# Patient Record
Sex: Female | Born: 1975 | Race: White | Hispanic: No | Marital: Married | Smoking: Never smoker
Health system: Southern US, Community
[De-identification: ages and names within clinical notes are randomized; demographics above are authoritative.]

## PROBLEM LIST (undated history)

## (undated) DIAGNOSIS — L409 Psoriasis, unspecified: Secondary | ICD-10-CM

## (undated) HISTORY — PX: WISDOM TOOTH EXTRACTION: SHX21

## (undated) HISTORY — DX: Psoriasis, unspecified: L40.9

---

## 2011-11-11 DIAGNOSIS — J45909 Unspecified asthma, uncomplicated: Secondary | ICD-10-CM | POA: Insufficient documentation

## 2016-01-14 ENCOUNTER — Encounter: Payer: Self-pay | Admitting: Obstetrics & Gynecology

## 2016-01-14 ENCOUNTER — Ambulatory Visit (INDEPENDENT_AMBULATORY_CARE_PROVIDER_SITE_OTHER): Payer: BLUE CROSS/BLUE SHIELD | Admitting: Obstetrics & Gynecology

## 2016-01-14 VITALS — BP 139/78 | HR 52 | Resp 16 | Ht 64.25 in | Wt 169.0 lb

## 2016-01-14 DIAGNOSIS — N921 Excessive and frequent menstruation with irregular cycle: Secondary | ICD-10-CM

## 2016-01-14 DIAGNOSIS — T384X5A Adverse effect of oral contraceptives, initial encounter: Secondary | ICD-10-CM

## 2016-01-14 MED ORDER — ESTRADIOL 1 MG PO TABS: 1.0000 mg | ORAL_TABLET | Freq: Every day | ORAL | Status: DC

## 2016-01-15 DIAGNOSIS — T384X5A Adverse effect of oral contraceptives, initial encounter: Secondary | ICD-10-CM | POA: Insufficient documentation

## 2016-01-15 NOTE — Progress Notes (Signed)
   Subjective:    Patient ID: Latoya Fisher, female    DOB: [birth year], 40 y.o.   MRN: [id number]  HPI  40 year old nulliparous female presents for evaluation of bleeding while on continuous OCPs. Patient's menstrual history is as follows.  Menarche around [age]. Patient went on birth control around [age] due to severe heavy periods are irregular. Patient was on 1 pack a month and had a normal menses her life. She never remembers going off birth control for long periods of time. Several years ago she married and wanted to have less menstrual cycles in order to have a better sexual life. The patient started taking continuous OCPs and having 1. Every 3 packs. This went well for approximately 1-1/2 years. The past few months she has had some spotting before the placebo pills. This last instance she bled for 12 days. Patient would like the bleeding to stop. Patient has no associated pelvic pain. The bleeding is mostly spotting and brown. Patient is not taking anything to make the bleeding better or worse.  Review of Systems  Constitutional: Negative for activity change, fatigue and unexpected weight change.  Respiratory: Negative.   Cardiovascular: Negative.   Gastrointestinal: Negative.   Genitourinary: Positive for menstrual problem. Negative for pelvic pain.  Musculoskeletal: Negative.   Skin: Negative.   Psychiatric/Behavioral: Negative.    History reviewed. No pertinent past medical history.     Objective:   Physical Exam  Constitutional: She is oriented to person, place, and time. She appears well-developed and well-nourished. No distress.  HENT:  Head: Normocephalic and atraumatic.  Eyes: Conjunctivae are normal.  Cardiovascular: Regular rhythm.   Pulmonary/Chest: Effort normal and breath sounds normal.  Abdominal: Soft. Bowel sounds are normal. She exhibits no distension and no mass. There is no tenderness. There is no rebound and no guarding.  Genitourinary:  Vulva: No  lesion Tanner 5 Vagina pink normal rugated with small amount of old blood at the top of the vault. Cervix nulliparous no lesion Uterus not enlarged and nontender Adnexa no masses nontender  Musculoskeletal: She exhibits no edema.  Neurological: She is alert and oriented to person, place, and time.  Skin: Skin is warm and dry.  Psychiatric: She has a normal mood and affect.  Vitals reviewed.         Assessment & Plan:  40 yo female nulliparous female with bleeding on continuous OPCPs   1-Offered TVUS to look for lining thickness and structural abnormality vs changing to having monthly menses with typical use of OCPs. 2- Due to insurance (high deductible) pt will try monthly menses.  She will ad estradiol 1 mg for 5 days, then finish this pack including placebo.  If bleeding profile becomes normal on monthly use of OCPS (including placebo), no further workup is necessary.  If still having bleeding will proceed with TVUS. 3-  RTC 2 months

## 2016-02-18 ENCOUNTER — Telehealth: Payer: Self-pay | Admitting: *Deleted

## 2016-02-18 ENCOUNTER — Encounter: Payer: Self-pay | Admitting: Obstetrics & Gynecology

## 2016-02-18 DIAGNOSIS — N939 Abnormal uterine and vaginal bleeding, unspecified: Secondary | ICD-10-CM

## 2016-02-18 NOTE — Telephone Encounter (Signed)
Received e-mail from patient stating that she is continuing to have bleeding every day.  Per Dr Gala Romney ordered placed for a pelvic U/S .  LM on pt's voicemail to call office to schedule the U/S.

## 2016-02-22 ENCOUNTER — Ambulatory Visit (INDEPENDENT_AMBULATORY_CARE_PROVIDER_SITE_OTHER): Payer: BLUE CROSS/BLUE SHIELD

## 2016-02-22 DIAGNOSIS — N939 Abnormal uterine and vaginal bleeding, unspecified: Secondary | ICD-10-CM

## 2016-02-26 ENCOUNTER — Encounter: Payer: Self-pay | Admitting: Obstetrics & Gynecology

## 2016-03-31 ENCOUNTER — Ambulatory Visit (INDEPENDENT_AMBULATORY_CARE_PROVIDER_SITE_OTHER): Payer: BLUE CROSS/BLUE SHIELD | Admitting: Obstetrics & Gynecology

## 2016-03-31 ENCOUNTER — Encounter: Payer: Self-pay | Admitting: Obstetrics & Gynecology

## 2016-03-31 VITALS — BP 119/73 | HR 55 | Resp 16 | Ht 64.5 in | Wt 165.0 lb

## 2016-03-31 DIAGNOSIS — Z3041 Encounter for surveillance of contraceptive pills: Secondary | ICD-10-CM

## 2016-03-31 DIAGNOSIS — N926 Irregular menstruation, unspecified: Secondary | ICD-10-CM | POA: Diagnosis not present

## 2016-03-31 MED ORDER — PORTIA-28 0.15-30 MG-MCG PO TABS: 1.0000 | ORAL_TABLET | Freq: Every day | ORAL | Status: DC

## 2016-03-31 NOTE — Progress Notes (Signed)
Latoya Fisher presents for follow-up of menstrual abnormality on her oral contraceptives. She did continue to have bleeding daily until 02/22/2016, at that point she was going for her transvaginal ultrasound. She change her continuous oral contraceptives to taking the placebo pills each month. March was her first menses of taking the birth control pills with placebo. Since March, patient has not had any bleeding. She is due for her period this week. Her transvaginal ultrasound shows a normal uterus with normal contour and a 2 mm lining. Results are below.  FINDINGS: Uterus  Measurements: 7.5 x 2.9 x 4.7 cm. No fibroids or other mass visualized.  Endometrium  Thickness: 2 mm in thickness. No focal abnormality visualized.  Right ovary  Measurements: 2.1 x 1.4 x 2.0 cm. Normal appearance/no adnexal mass.  Left ovary  Measurements: 2.9 x 1.6 x 2.0 cm. Normal appearance/no adnexal mass.  Other findings  Trace free fluid in the pelvis  IMPRESSION: Unremarkable study.  Filed Vitals:   03/31/16 0824  BP: 119/73  Pulse: 55  Resp: 16  Height: 5' 4.5" (1.638 m)  Weight: 165 lb (74.844 kg)     Patient had lengthy discussion about her menstrual period, taking continuous oral contraceptives, taking oral contraceptives with placebo pill, and the menstrual cycle one enters her 40s. Patient has decided to continue oral contraceptives take the placebo pills. She may go to periods f consecutive birth control pills later this summer. She has a birthday trip in October [place] and would not like to have her period during that time. She also had questions about conceiving. She is most likely not going to have children but has not completely ruled out this posibility. We will have further discussion if she decides to become pregnant.  Patient also had questions about when to start breast cancer screening. We discussed [employer] of obstetrics and gynecology, American  Cancer Society, and USPSTF recommendations.  Patient does not want to start at age 46. She comes back for her annual exam in January 2018, we will discuss when she would like to start mammograms for breast cancer screening. She will be due for a Pap smear at that time.  15 minutes spent face-to-face with the patient with greater than 50% counseling.

## 2016-12-22 ENCOUNTER — Ambulatory Visit: Payer: BLUE CROSS/BLUE SHIELD | Admitting: Obstetrics & Gynecology

## 2017-01-10 IMAGING — US US PELVIS COMPLETE
1 series · 14 of 25 positions shown · non-contrast
Comparison: None

CLINICAL DATA: Irregular vaginal bleeding for 1-2 years

EXAM:
TRANSABDOMINAL AND TRANSVAGINAL ULTRASOUND OF PELVIS
TECHNIQUE: Both transabdominal and transvaginal ultrasound examinations of the
pelvis were performed. Transabdominal technique was performed for
global imaging of the pelvis including uterus, ovaries, adnexal
regions, and pelvic cul-de-sac. It was necessary to proceed with
endovaginal exam following the transabdominal exam to visualize the
uterus, endometrium, ovaries and adnexa .

[Series 1: us pelvis complete · 0.16mm/px · 14 of 76 slices shown]
[im 1/76]
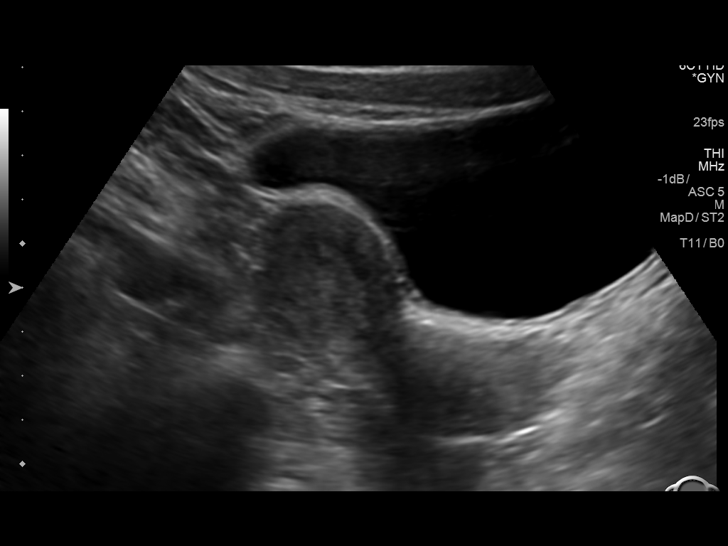
[im 7/76]
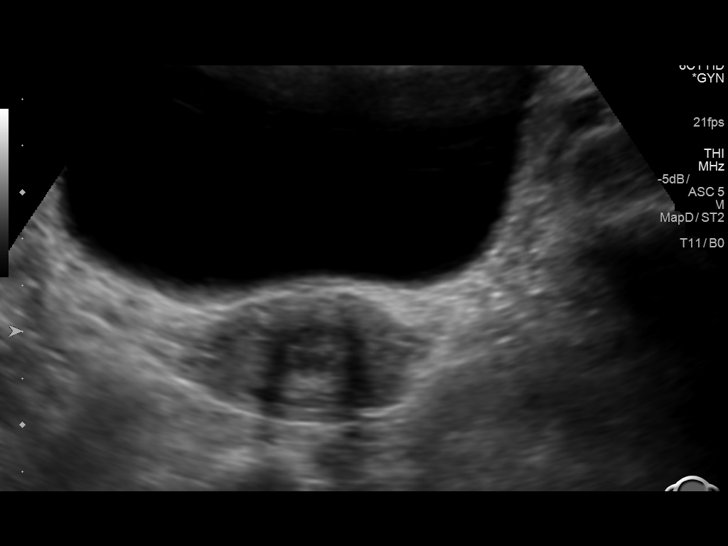
[im 13/76]
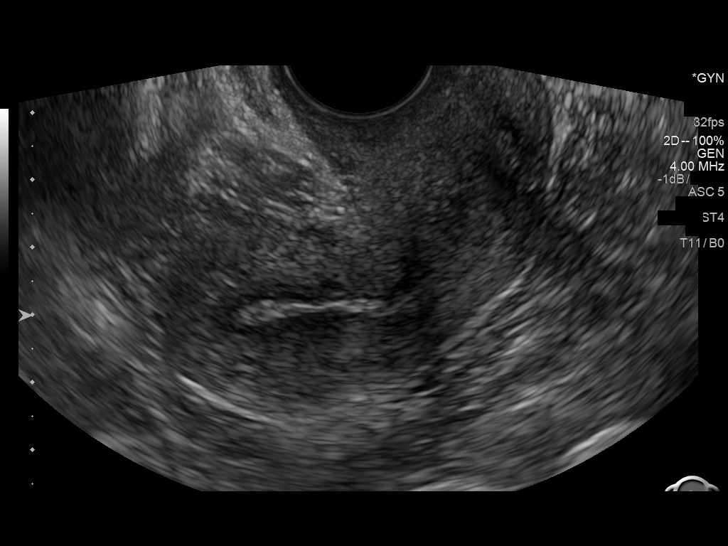
[im 19/76]
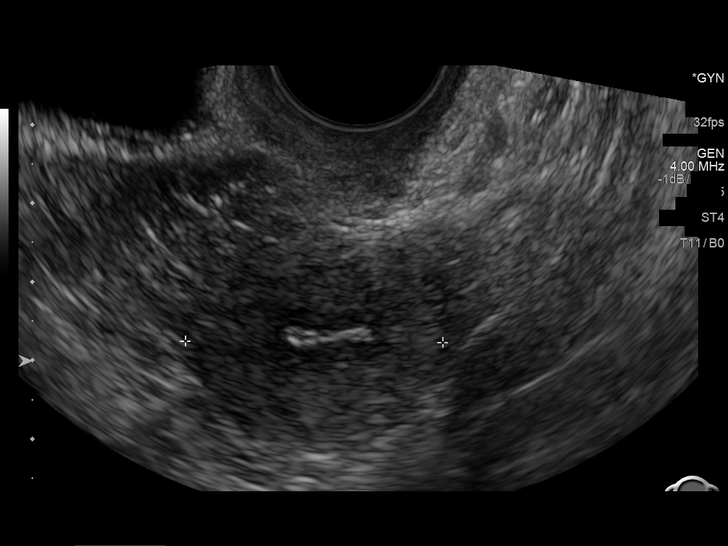
[im 26/76]
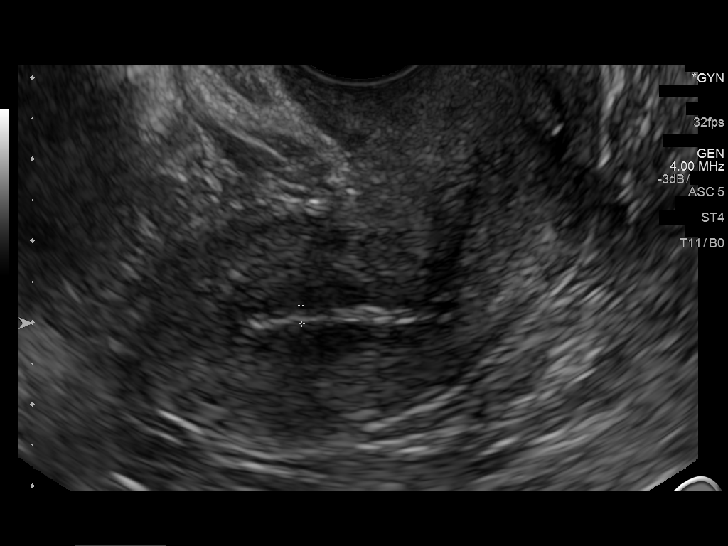
[im 29/76]
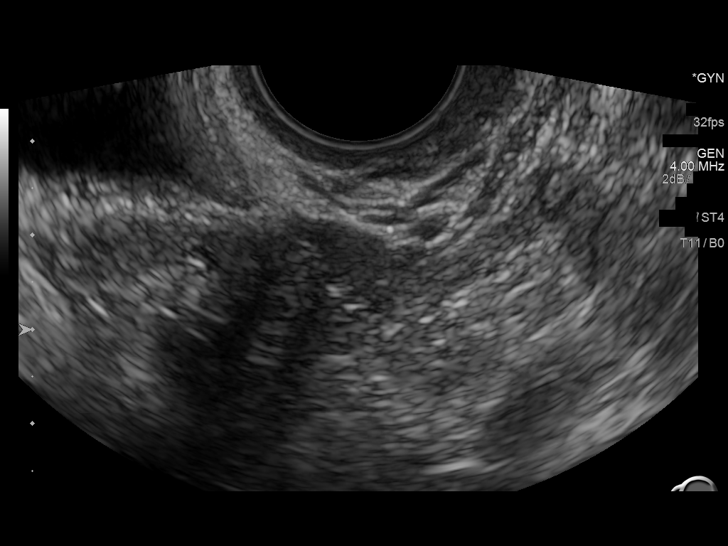
[im 35/76]
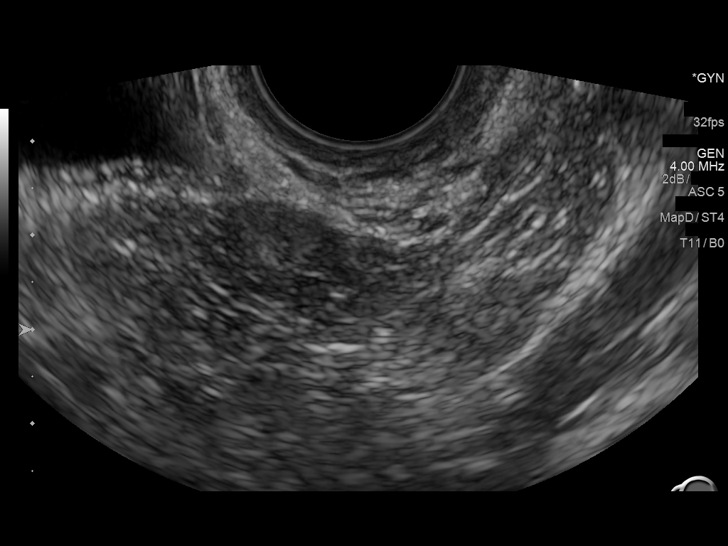
[im 41/76]
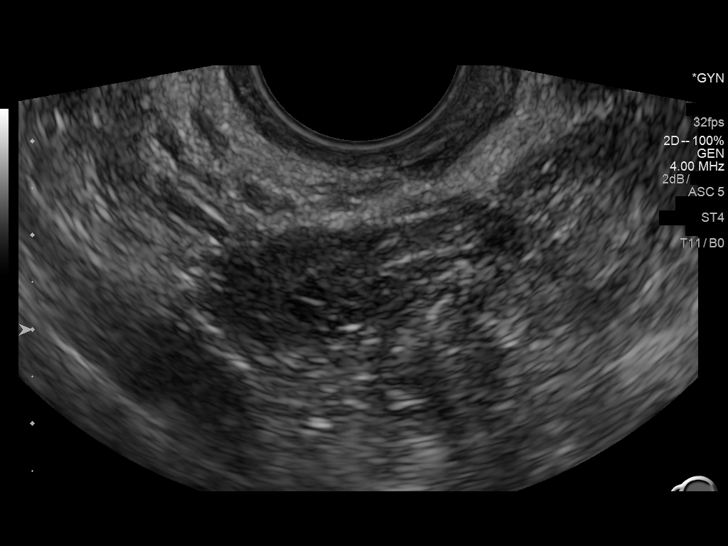
[im 47/76]
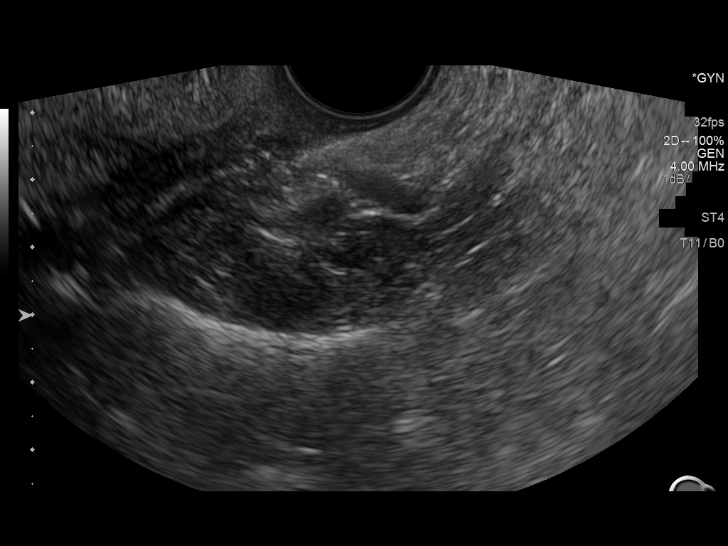
[im 51/76]
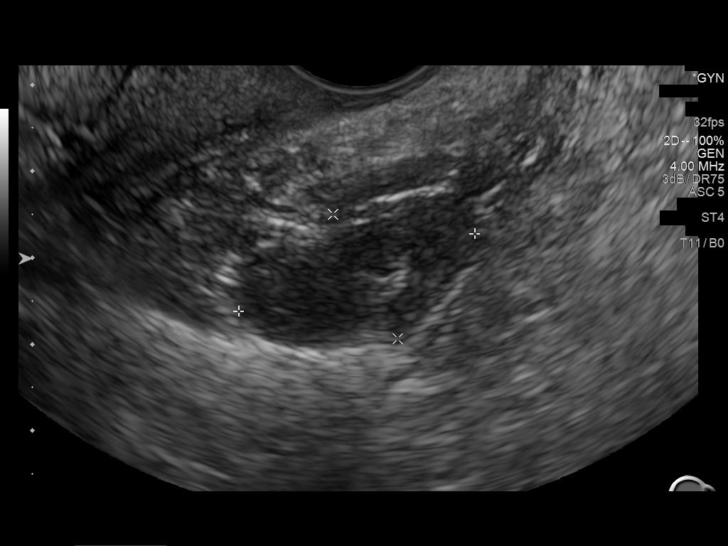
[im 57/76]
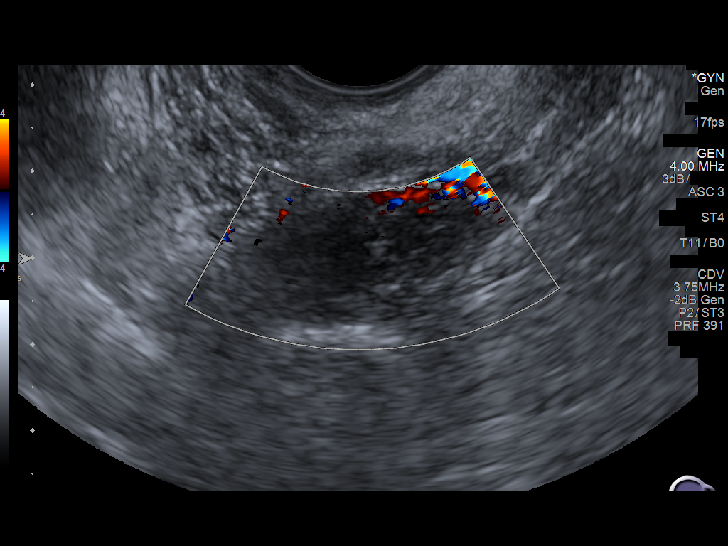
[im 63/76]
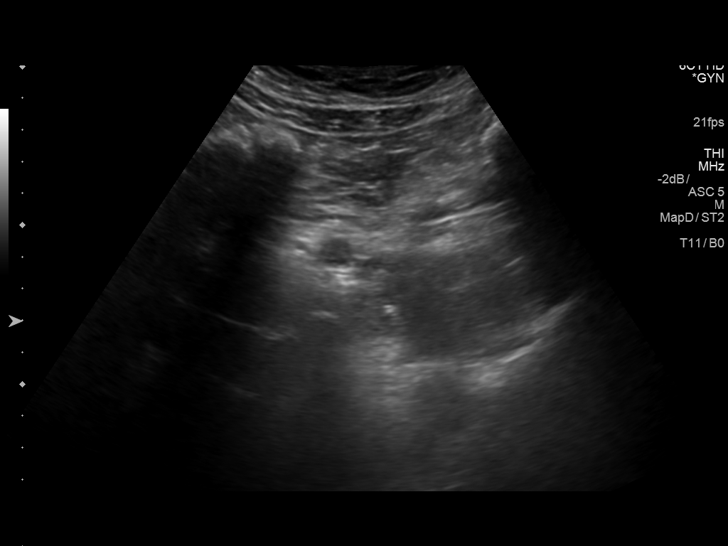
[im 69/76]
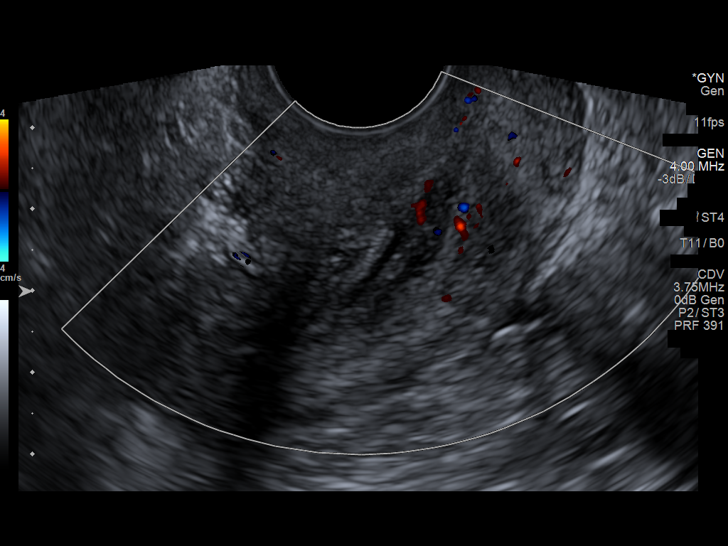
[im 76/76]
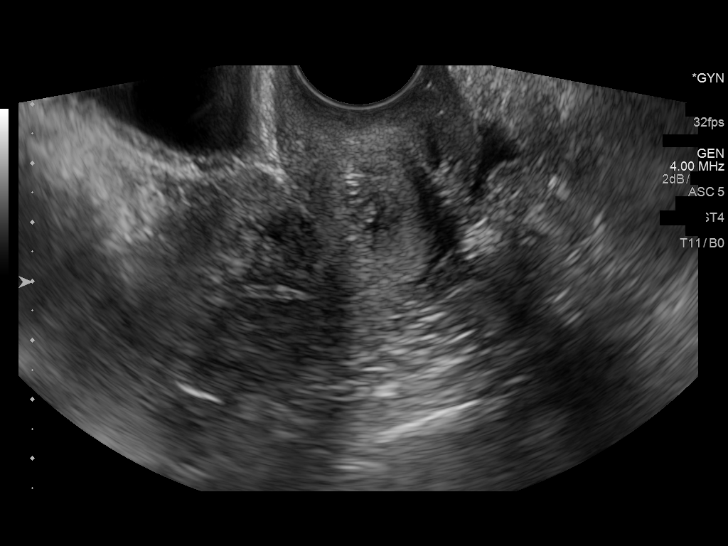

[14 of 25 positions shown; findings below may reference images not displayed]

FINDINGS: Uterus

Measurements: 7.5 x 2.9 x 4.7 cm. No fibroids or other mass
visualized.

Endometrium

Thickness: 2 mm in thickness.  No focal abnormality visualized.

Right ovary

Measurements: 2.1 x 1.4 x 2.0 cm. Normal appearance/no adnexal mass.

Left ovary

Measurements: 2.9 x 1.6 x 2.0 cm. Normal appearance/no adnexal mass.

Other findings

Trace free fluid in the pelvis
IMPRESSION: Unremarkable study.

## 2017-01-14 ENCOUNTER — Ambulatory Visit (INDEPENDENT_AMBULATORY_CARE_PROVIDER_SITE_OTHER): Payer: PRIVATE HEALTH INSURANCE | Admitting: Obstetrics & Gynecology

## 2017-01-14 ENCOUNTER — Encounter: Payer: Self-pay | Admitting: Obstetrics & Gynecology

## 2017-01-14 VITALS — BP 125/86 | HR 60 | Ht 64.0 in | Wt 167.0 lb

## 2017-01-14 DIAGNOSIS — Z01419 Encounter for gynecological examination (general) (routine) without abnormal findings: Secondary | ICD-10-CM

## 2017-01-14 DIAGNOSIS — Z0000 Encounter for general adult medical examination without abnormal findings: Secondary | ICD-10-CM

## 2017-01-14 DIAGNOSIS — N9089 Other specified noninflammatory disorders of vulva and perineum: Secondary | ICD-10-CM

## 2017-01-14 MED ORDER — LEVONORGESTREL-ETHINYL ESTRAD 0.15-30 MG-MCG PO TABS: 1.0000 | ORAL_TABLET | Freq: Every day | ORAL | 12 refills | Status: DC

## 2017-01-14 NOTE — Progress Notes (Signed)
Subjective:     Latoya Fisher is a 41 y.o. female here for a routine exam.  Current complaints: none.  Stopped continuous OCPs and bleeding went away.  Nml TVUS last year.  Gynecologic History Patient's last menstrual period was 01/03/2017. Contraception: OCP (estrogen/progesterone) Last Pap: at outside office--nml per patient; pap with cotesting done today Last mammogram: never--Long discussion on breast cancer screening.  Pt has maternal aunt with postmenopausal breast cancer.  Low risk.  Pt opts to not start mammogram at 10.  Will discuss net year.    Obstetric History OB History  Gravida Para Term Preterm AB Living  0 0 0 0 0 0  SAB TAB Ectopic Multiple Live Births  0 0 0 0           The following portions of the patient's history were reviewed and updated as appropriate: allergies, current medications, past family history, past medical history, past social history, past surgical history and problem list.  Review of Systems Pertinent items noted in HPI and remainder of comprehensive ROS otherwise negative.    Objective:      Vitals:   01/14/17 0831  BP: 125/86  Pulse: 60  Weight: 167 lb (75.8 kg)  Height: 5\' 4"  (1.626 m)   Vitals:  WNL General appearance: alert, cooperative and no distress  HEENT: Normocephalic, without obvious abnormality, atraumatic Eyes: negative Throat: lips, mucosa, and tongue normal; teeth and gums normal  Respiratory: Clear to auscultation bilaterally  CV: Regular rate and rhythm  Breasts:  Normal appearance, no masses or tenderness, no nipple retraction or dimpling  GI: Soft, non-tender; bowel sounds normal; no masses,  no organomegaly  GU: External Genitalia:  Tanner V, white "cigarette paper" lesion between vagina and anus--?psoriasis vs Lichen sclerosis Urethra:  No prolapse   Vagina: Pink, normal rugae, no blood or discharge  Cervix: No CMT, no lesion  Uterus:  Normal size and contour, non tender  Adnexa: Normal, no masses, non  tender  Musculoskeletal: No edema, redness or tenderness in the calves or thighs  Skin: No lesions or rash  Lymphatic: Axillary adenopathy: none     Psychiatric: Normal mood and behavior        Assessment:    Healthy female exam.   Vulvar lesion--LS vs Psoriasis   Plan:    1.   Vulvar biopsy this month 2-Pap with cotesting 3-Mammogram--pts opts to wait 4.  OCP Rx

## 2017-01-15 LAB — CYTOLOGY - PAP
DIAGNOSIS: NEGATIVE
HPV: NOT DETECTED

## 2017-01-28 ENCOUNTER — Telehealth: Payer: Self-pay

## 2017-01-28 NOTE — Telephone Encounter (Signed)
Schedule Latoya Fisher have to check her work calendar then will call office back to set up her bx appointment with Dr. Gala Romney.

## 2017-05-08 ENCOUNTER — Other Ambulatory Visit: Payer: Self-pay | Admitting: Obstetrics & Gynecology

## 2017-05-11 ENCOUNTER — Telehealth: Payer: Self-pay | Admitting: *Deleted

## 2017-05-11 NOTE — Telephone Encounter (Signed)
M on cell phone that she was given 2 months of OCP's and questioned her about the vulvar biopsy.  Told pt to call the office  With what her plans were for having this biopsy since there is no appt scheduled.

## 2017-05-11 NOTE — Telephone Encounter (Signed)
-----   Message from Guss Bunde, MD sent at 05/11/2017 12:10 PM EDT ----- Pt was supposed to get vulvar biopsy.  Can you call and see when she plans to come in?  OCPS renewed for 2 months today.

## 2017-11-18 ENCOUNTER — Encounter: Payer: Self-pay | Admitting: Obstetrics & Gynecology

## 2017-11-18 ENCOUNTER — Ambulatory Visit: Payer: PRIVATE HEALTH INSURANCE | Admitting: Obstetrics & Gynecology

## 2017-11-18 VITALS — BP 132/86 | HR 62 | Ht 64.0 in | Wt 159.0 lb

## 2017-11-18 DIAGNOSIS — N898 Other specified noninflammatory disorders of vagina: Secondary | ICD-10-CM | POA: Diagnosis not present

## 2017-11-18 DIAGNOSIS — N9089 Other specified noninflammatory disorders of vulva and perineum: Secondary | ICD-10-CM

## 2017-11-20 NOTE — Progress Notes (Signed)
   Subjective:    Patient ID: Latoya Fisher, female    DOB: 04-24-1976, 41 y.o.   MRN: [id number]  HPI  Pt presents for f/u to receive vulvar biopsy of area at 6 o'clock seen at yearly exam.  R/o lichen planus / lichen sclerosis. Pt c/o discharge as well.  No odor currently.    Review of Systems  Constitutional: Negative.   Gastrointestinal: Negative.   Genitourinary: Positive for vaginal discharge. Negative for pelvic pain and vaginal pain.       Objective:   Physical Exam  Constitutional: She is oriented to person, place, and time. She appears well-developed and well-nourished. No distress.  HENT:  Head: Normocephalic and atraumatic.  Eyes: Conjunctivae are normal.  Pulmonary/Chest: Effort normal.  Abdominal: Soft. Bowel sounds are normal. There is no tenderness.  Genitourinary:     Genitourinary Comments: Moderate amont of discharge.  Musculoskeletal: She exhibits no edema.  Neurological: She is alert and oriented to person, place, and time.  Skin: Skin is warm and dry.  Psychiatric: She has a normal mood and affect.  Vitals reviewed.  Vitals:   11/18/17 0911  BP: 132/86  Pulse: 62  Weight: 159 lb (72.1 kg)  Height: 5\' 4"  (1.626 m)          Assessment & Plan:  41 yo female with area on vulva concerning for lichen planus. And vaginal discharge.  1.  BD affirm 2.  Vulvar biopsy  VULVAR BIOPSY NOTE  The indications for vulvar biopsy (rule out neoplasia, establish lichen sclerosus diagnosis) were reviewed.   Risks of the biopsy including pain, bleeding, infection, inadequate specimen, scarring and need for additional procedures  were discussed. The patient stated understanding and agreed to undergo procedure today. Consent was signed,  time out performed.  The patient's vulva was prepped with Betadine. 1% lidocaine was injected into fourchette (6 o'clock). A 3-mm punch biopsy was done, biopsy tissue was picked up with sterile forceps and sterile scissors were  used to excise the lesion.  Small bleeding was noted and hemostasis was achieved using silver nitrate sticks.  The patient tolerated the procedure well. Post-procedure instructions  (pelvic rest for one week) were given to the patient. The patient is to call with heavy bleeding, fever greater than 100.4, foul smelling vaginal discharge or other concerns. The patient will be return to clinic in two weeks for discussion of results.

## 2017-11-25 ENCOUNTER — Encounter: Payer: Self-pay | Admitting: Obstetrics & Gynecology

## 2017-12-03 ENCOUNTER — Telehealth: Payer: Self-pay | Admitting: *Deleted

## 2017-12-03 NOTE — Telephone Encounter (Signed)
-----   Message from Guss Bunde, MD sent at 11/27/2017 11:42 AM EST ----- No vaginitis.  Biopsy shows irritant contact dermatitis.  Use scent free soaps and detergents.  If bothersome, can prescribe topical steroid.  RN to call.

## 2017-12-03 NOTE — Telephone Encounter (Signed)
LM on voicemail of biopsy results and recommendations.

## 2018-03-10 ENCOUNTER — Encounter: Payer: Self-pay | Admitting: Obstetrics & Gynecology

## 2018-03-10 ENCOUNTER — Other Ambulatory Visit: Payer: Self-pay | Admitting: *Deleted

## 2018-03-10 MED ORDER — LEVONORGESTREL-ETHINYL ESTRAD 0.15-30 MG-MCG PO TABS: 1.0000 | ORAL_TABLET | Freq: Every day | ORAL | 1 refills | Status: DC

## 2018-03-10 NOTE — Telephone Encounter (Signed)
Pt called stating that she was running out of her OCP's.  She stated that she was seen 12/18 for a vulvar biopsy and did she still need to come in for an annual.  I told her she could have 2 RF's on OCP's but yes she needed and annual and mammogram.  I gave her the number to our imaging center.  RF was sent to [place].

## 2018-04-06 ENCOUNTER — Encounter: Payer: Self-pay | Admitting: Obstetrics & Gynecology

## 2018-04-13 ENCOUNTER — Telehealth: Payer: Self-pay | Admitting: *Deleted

## 2018-04-13 MED ORDER — ESTRADIOL 2 MG PO TABS: 2.0000 mg | ORAL_TABLET | Freq: Every day | ORAL | 0 refills | Status: AC

## 2018-04-13 NOTE — Telephone Encounter (Signed)
Pt notified that per VO Dr Gala Romney has ordered Estradiol 2 mg to take daily for 7 days with her OCP's.  If she has bleeding while on vacation she should double up her OCP's for 3 days  [name]

## 2018-05-05 ENCOUNTER — Ambulatory Visit (INDEPENDENT_AMBULATORY_CARE_PROVIDER_SITE_OTHER): Payer: PRIVATE HEALTH INSURANCE | Admitting: Obstetrics & Gynecology

## 2018-05-05 ENCOUNTER — Encounter: Payer: Self-pay | Admitting: Obstetrics & Gynecology

## 2018-05-05 VITALS — BP 129/86 | HR 61 | Resp 16 | Ht 64.0 in | Wt 163.0 lb

## 2018-05-05 DIAGNOSIS — R5383 Other fatigue: Secondary | ICD-10-CM

## 2018-05-05 DIAGNOSIS — Z01419 Encounter for gynecological examination (general) (routine) without abnormal findings: Secondary | ICD-10-CM

## 2018-05-05 DIAGNOSIS — Z01411 Encounter for gynecological examination (general) (routine) with abnormal findings: Secondary | ICD-10-CM

## 2018-05-05 MED ORDER — LEVONORGESTREL-ETHINYL ESTRAD 0.15-30 MG-MCG PO TABS: ORAL_TABLET | ORAL | 5 refills | Status: DC

## 2018-05-05 NOTE — Addendum Note (Signed)
Addended by: Lyndal Rainbow on: 05/05/2018 11:45 AM   Modules accepted: Orders

## 2018-05-05 NOTE — Addendum Note (Signed)
Addended by: Lyndal Rainbow on: 05/05/2018 11:13 AM   Modules accepted: Orders

## 2018-05-05 NOTE — Progress Notes (Signed)
Subjective:     Latoya Fisher is a 42 y.o. female here for a routine exam.  Current complaints: No longer having vaginal irritation.  Husband stopped using Ax body wash and irritation went away.  Pt having some bleeding during OCPs.  She occasionally skips the placebo pills and then bleeds longer on the third pack.  Pt did not ned to take estrogen (see last telephone note).  Pt is running races and triathlons all summer.  Would like better cycle control. Pt more fatigued than normal and would like screening labs. Unsure if coming form training.   Gynecologic History Patient's last menstrual period was 03/25/2018. Contraception: OCP (estrogen/progesterone) Last Pap: 2018. Results were: normal Last mammogram: unsure, but patient thinks she is due.  Concerned about area on right breast under nipple.   Obstetric History OB History  Gravida Para Term Preterm AB Living  0 0 0 0 0 0  SAB TAB Ectopic Multiple Live Births  0 0 0 0       The following portions of the patient's history were reviewed and updated as appropriate: allergies, current medications, past family history, past medical history, past social history, past surgical history and problem list.  Review of Systems Pertinent items noted in HPI and remainder of comprehensive ROS otherwise negative.    Objective:      Vitals:   05/05/18 0827  BP: 129/86  Pulse: 61  Resp: 16  Weight: 163 lb (73.9 kg)  Height: 5\' 4"  (1.626 m)   Vitals:  WNL General appearance: alert, cooperative and no distress  HEENT: Normocephalic, without obvious abnormality, atraumatic Eyes: negative Throat: lips, mucosa, and tongue normal; teeth and gums normal  Respiratory: Clear to auscultation bilaterally  CV: Regular rate and rhythm  Breasts:  Normal appearance, subcentimeter superficial mass at 7 o'clock under right nipple.  Feels like an inclusion cyst but unsure--DIAGNOTIC MAMMOGRAM  GI: Soft, non-tender; bowel sounds normal; no masses,  no  organomegaly  GU: External Genitalia:  Tanner V, no lesion Urethra:  No prolapse   Vagina: Pink, normal rugae, no blood or discharge  Cervix: No CMT, no lesion  Uterus:  Normal size and contour, non tender  Adnexa: Normal, no masses, non tender  Musculoskeletal: No edema, redness or tenderness in the calves or thighs  Skin: No lesions or rash  Lymphatic: Axillary adenopathy: none     Psychiatric: Normal mood and behavior    Assessment:    Healthy female exam.   Right superficial breast lump Fatigue Bleeding on OCPs   Plan:    1.  Diagnositic mammogram right breast. 2.  Labs as per orders 3.  Pap not due this year. 4.  Try continuous OCPs for 6 months during race season.  Use estrogen prn.  Pt to call or message [place] with with concerns, questions or bleeding.

## 2018-05-07 ENCOUNTER — Other Ambulatory Visit: Payer: PRIVATE HEALTH INSURANCE

## 2018-05-08 LAB — LIPID PANEL
CHOLESTEROL: 143 mg/dL (ref <200)
HDL: 49 mg/dL — AB (ref >50)
LDL Cholesterol (Calc): 75 mg/dL (calc)
Non-HDL Cholesterol (Calc): 94 mg/dL (calc) (ref <130)
Total CHOL/HDL Ratio: 2.9 (calc) (ref <5.0)
Triglycerides: 100 mg/dL (ref <150)

## 2018-05-08 LAB — COMPREHENSIVE METABOLIC PANEL
AG RATIO: 1.5 (calc) (ref 1.0–2.5)
ALT: 13 U/L (ref 6–29)
AST: 17 U/L (ref 10–30)
Albumin: 4 g/dL (ref 3.6–5.1)
Alkaline phosphatase (APISO): 54 U/L (ref 33–115)
BILIRUBIN TOTAL: 0.5 mg/dL (ref 0.2–1.2)
BUN: 14 mg/dL (ref 7–25)
CO2: 22 mmol/L (ref 20–32)
Calcium: 9.1 mg/dL (ref 8.6–10.2)
Chloride: 110 mmol/L (ref 98–110)
Creat: 0.93 mg/dL (ref 0.50–1.10)
GLUCOSE: 101 mg/dL — AB (ref 65–99)
Globulin: 2.6 g/dL (calc) (ref 1.9–3.7)
Potassium: 5 mmol/L (ref 3.5–5.3)
Sodium: 140 mmol/L (ref 135–146)
Total Protein: 6.6 g/dL (ref 6.1–8.1)

## 2018-05-08 LAB — CBC
HEMATOCRIT: 41 % (ref 35.0–45.0)
Hemoglobin: 14.1 g/dL (ref 11.7–15.5)
MCH: 32.2 pg (ref 27.0–33.0)
MCHC: 34.4 g/dL (ref 32.0–36.0)
MCV: 93.6 fL (ref 80.0–100.0)
MPV: 10.3 fL (ref 7.5–12.5)
Platelets: 290 10*3/uL (ref 140–400)
RBC: 4.38 10*6/uL (ref 3.80–5.10)
RDW: 12.7 % (ref 11.0–15.0)
WBC: 8.9 10*3/uL (ref 3.8–10.8)

## 2018-05-08 LAB — TSH: TSH: 3.01 mIU/L

## 2018-05-08 LAB — VITAMIN D 25 HYDROXY (VIT D DEFICIENCY, FRACTURES): VIT D 25 HYDROXY: 25 ng/mL — AB (ref 30–100)

## 2018-05-24 ENCOUNTER — Encounter: Payer: Self-pay | Admitting: Obstetrics & Gynecology

## 2018-05-24 DIAGNOSIS — R7989 Other specified abnormal findings of blood chemistry: Secondary | ICD-10-CM | POA: Insufficient documentation

## 2018-05-24 DIAGNOSIS — N6089 Other benign mammary dysplasias of unspecified breast: Secondary | ICD-10-CM | POA: Insufficient documentation

## 2018-05-25 ENCOUNTER — Telehealth: Payer: Self-pay | Admitting: *Deleted

## 2018-05-25 MED ORDER — VITAMIN D (ERGOCALCIFEROL) 1.25 MG (50000 UNIT) PO CAPS: 50000.0000 [IU] | ORAL_CAPSULE | ORAL | 0 refills | Status: DC

## 2018-05-25 NOTE — Telephone Encounter (Signed)
LM on voicemail to inquire whether her labs were actually fasting.  Her glucose was elevated and recommended she f/u with her PCP for weight management and may need a nutrition consult.  Also her Vitamin D level was low and RX for Vitamin D sent to [facility] outpatient pharmacy.  She is to return in about 7 weeks for repeat Vit D level.

## 2018-05-25 NOTE — Telephone Encounter (Signed)
-----   Message from Guss Bunde, MD sent at 05/24/2018  5:44 AM EDT ----- Fasting blood glucose is 101 (Rn to ensure patient was fasting).  Pt needs f/u with PCP and nutrition for optimal eating plan for weight reduction.   Low Vit D--replace with 50,000 units weekly and redraw in 6 weeks.

## 2019-05-09 ENCOUNTER — Ambulatory Visit: Payer: PRIVATE HEALTH INSURANCE | Admitting: Obstetrics & Gynecology

## 2019-05-12 ENCOUNTER — Telehealth: Payer: Self-pay | Admitting: *Deleted

## 2019-05-12 DIAGNOSIS — N939 Abnormal uterine and vaginal bleeding, unspecified: Secondary | ICD-10-CM

## 2019-05-12 NOTE — Telephone Encounter (Signed)
-----   Message from Guss Bunde, MD sent at 05/12/2019  7:24 AM EDT ----- Can you order a TVUS for [facility] and call her.  She will need a biopsy at her next visit.  I also told her to take her OCPs twice a day for 4 days to see if this helps her bleeding.

## 2019-05-12 NOTE — Telephone Encounter (Signed)
Pt notified of Dr Leggett's plans for bleeding W/U.  TVU scheduled and she will have endo Bx with her annual on 06/06/19.  She was informed to take 600 mg Ibuprofen about 30 min prior to appt but not on an empty stomach.

## 2019-05-13 ENCOUNTER — Ambulatory Visit (INDEPENDENT_AMBULATORY_CARE_PROVIDER_SITE_OTHER): Payer: PRIVATE HEALTH INSURANCE

## 2019-05-13 ENCOUNTER — Other Ambulatory Visit: Payer: Self-pay

## 2019-05-13 DIAGNOSIS — N939 Abnormal uterine and vaginal bleeding, unspecified: Secondary | ICD-10-CM

## 2019-05-18 ENCOUNTER — Telehealth: Payer: Self-pay | Admitting: *Deleted

## 2019-05-18 MED ORDER — MISOPROSTOL 200 MCG PO TABS: ORAL_TABLET | ORAL | 0 refills | Status: AC

## 2019-05-18 NOTE — Telephone Encounter (Signed)
-----   Message from Guss Bunde, MD sent at 05/16/2019  8:39 AM EDT ----- Small mass in the endocervical region.  Pt needs exam and possible biopsy.  Please call in cytotec--vaginal if patient sees me.

## 2019-05-18 NOTE — Telephone Encounter (Signed)
Pt notified to be prepared for endometrial biopsy at her appt on 6/29.  Per Dr Gala Romney Cytotec 200 mh #2 sent to [facility] outpatient pharmacy to place into vagina the night prior to procedure.

## 2019-06-06 ENCOUNTER — Other Ambulatory Visit (HOSPITAL_COMMUNITY)
Admission: RE | Admit: 2019-06-06 | Discharge: 2019-06-06 | Disposition: A | Discharge status: Home / Self Care | Payer: PRIVATE HEALTH INSURANCE | Source: Ambulatory Visit | Attending: Obstetrics & Gynecology | Admitting: Obstetrics & Gynecology

## 2019-06-06 ENCOUNTER — Encounter: Payer: Self-pay | Admitting: Obstetrics & Gynecology

## 2019-06-06 ENCOUNTER — Other Ambulatory Visit: Payer: Self-pay

## 2019-06-06 ENCOUNTER — Ambulatory Visit: Payer: PRIVATE HEALTH INSURANCE | Admitting: Obstetrics & Gynecology

## 2019-06-06 VITALS — BP 126/76 | HR 54 | Resp 16 | Ht 64.0 in | Wt 170.0 lb

## 2019-06-06 DIAGNOSIS — Z124 Encounter for screening for malignant neoplasm of cervix: Secondary | ICD-10-CM

## 2019-06-06 DIAGNOSIS — Z3202 Encounter for pregnancy test, result negative: Secondary | ICD-10-CM | POA: Diagnosis not present

## 2019-06-06 DIAGNOSIS — Z0000 Encounter for general adult medical examination without abnormal findings: Secondary | ICD-10-CM

## 2019-06-06 DIAGNOSIS — N926 Irregular menstruation, unspecified: Secondary | ICD-10-CM

## 2019-06-06 DIAGNOSIS — Z1151 Encounter for screening for human papillomavirus (HPV): Secondary | ICD-10-CM

## 2019-06-06 DIAGNOSIS — N840 Polyp of corpus uteri: Secondary | ICD-10-CM | POA: Diagnosis not present

## 2019-06-06 LAB — POCT URINE PREGNANCY: Preg Test, Ur: NEGATIVE

## 2019-06-06 MED ORDER — LEVONORGESTREL-ETHINYL ESTRAD 0.15-30 MG-MCG PO TABS: ORAL_TABLET | ORAL | 5 refills | Status: AC

## 2019-06-06 NOTE — Progress Notes (Signed)
Subjective:     Latoya Fisher is a 43 y.o. female here for a routine exam.  Current complaints: bleeding on OCPs.  Pt was trying to take continuously to help with training schedule.  Pt had spotting with taking 3 months in a row without placebo.  Pt is going to try taking pill with monthly placebo pills to see if this helps.  TVUS was done.  ?cervical mass seen; ovaries not seen.  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 She was very disappointed and this led to depression.  She is seeing a therapist for this right now ans is returning to exercise.    Gynecologic History Patient's last menstrual period was 06/01/2019. Contraception: OCP (estrogen/progesterone) Last Pap: 2018. Results were: normal Last mammogram: None on chart  Obstetric History OB History  Gravida Para Term Preterm AB Living  0 0 0 0 0 0  SAB TAB Ectopic Multiple Live Births  0 0 0 0       The following portions of the patient's history were reviewed and updated as appropriate: allergies, current medications, past family history, past medical history, past social history, past surgical history and problem list.  Review of Systems Pertinent items noted in HPI and remainder of comprehensive ROS otherwise negative.    Objective:      Vitals:   06/06/19 0832  BP: 126/76  Pulse: (!) 54  Resp: 16  Weight: 170 lb (77.1 kg)  Height: 5\' 4"  (1.626 m)   Vitals:  WNL General appearance: alert, cooperative and no distress  HEENT: Normocephalic, without obvious abnormality, atraumatic Eyes: negative Throat: lips, mucosa, and tongue normal; teeth and gums normal  Respiratory: Clear to auscultation bilaterally  CV: Regular rate and rhythm  Breasts:  Normal appearance, no masses or tenderness, no nipple retraction or dimpling  GI: Soft, non-tender; bowel sounds normal; no masses,  no organomegaly  GU: External Genitalia:  Tanner V, no lesion Urethra:  No prolapse   Vagina: Pink, normal  rugae, no blood or discharge  Cervix: No CMT, no lesion; blood and probable mucous coming form os (pt on end of menses)  Uterus:  Normal size and contour, non tender  Adnexa: Normal, no masses, non tender  Musculoskeletal: No edema, redness or tenderness in the calves or thighs  Skin: No lesions or rash  Lymphatic: Axillary adenopathy: none     Psychiatric: Normal mood and behavior   FINDINGS: Uterus  Measurements: 6.7 x 3.2 x 3.7 cm = volume: 41 mL. No fibroids or other mass visualized.There is apparent thickening and heterogeneity of the endocervical canal. The abnormal region measures 2.7 x 1.3 x 1.6 cm.  Endometrium  Thickness: 3 mm.  No focal abnormality visualized.  Right ovary  Not visualized  Left ovary  Not visualized  Other findings:  No abnormal free fluid  IMPRESSION: There is nonspecific thickening and heterogeneity of the soft tissues within the endometrial canal. Consider correlation with direct visualization.  Endometrium measures 3 mm. If bleeding remains unresponsive to hormonal or medical therapy, sonohysterogram should be considered for focal lesion work-up. (Ref: Radiological Reasoning: Algorithmic Workup of Abnormal Vaginal Bleeding with Endovaginal Sonography and Sonohysterography. AJR 2008; [id number]:K16-01)   Electronically Signed   By: Lovey Newcomer M.D.   On: 05/14/2019 06:19      Assessment:    Healthy female exam.   Endocervical thickening Bleeding of OCPs   Plan:    1.  Take placebo pills each month  2.  Pap today due to US findings 3.  Rpt [place] in 10 weeks at [facility] office 4.  Mammogram screening  ENDOMETRIAL BIOPSY     The indications for endometrial biopsy were reviewed.   Risks of the biopsy including cramping, bleeding, infection, uterine perforation, inadequate specimen and need for additional procedures  were discussed. The patient states she understands and agrees to undergo procedure today. Consent was signed. Time  out was performed. Urine HCG was negative. A sterile speculum was placed in the patient's vagina and the cervix was prepped with Betadine. A single-toothed tenaculum was placed on the anterior lip of the cervix to stabilize it. The 3 mm pipelle was introduced into the endometrial cavity without difficulty to a depth of 8 cm, and a moderate amount of tissue was obtained and sent to pathology. The instruments were removed from the patient's vagina. Minimal bleeding from the cervix was noted. The patient tolerated the procedure well. Routine post-procedure instructions were given to the patient. The patient will follow up to review the results and for further management.

## 2019-06-06 NOTE — Addendum Note (Signed)
Addended by: Guss Bunde on: 06/06/2019 05:03 PM   Modules accepted: Orders

## 2019-06-07 ENCOUNTER — Other Ambulatory Visit: Payer: Self-pay | Admitting: Obstetrics & Gynecology

## 2019-06-08 LAB — CYTOLOGY - PAP
Diagnosis: NEGATIVE
HPV: NOT DETECTED

## 2019-06-15 ENCOUNTER — Encounter: Payer: Self-pay | Admitting: *Deleted

## 2019-06-15 ENCOUNTER — Other Ambulatory Visit: Payer: Self-pay | Admitting: *Deleted

## 2019-06-15 DIAGNOSIS — D259 Leiomyoma of uterus, unspecified: Secondary | ICD-10-CM

## 2019-06-15 NOTE — Progress Notes (Signed)
Referral and PA for Pelvic MRI w/wo contrast @ [facility].  Hospital to call pt to schedule.  PA [id number] Spoke with Myriam Jacobson RN @ [place]

## 2019-06-21 ENCOUNTER — Encounter: Payer: Self-pay | Admitting: *Deleted

## 2019-07-06 ENCOUNTER — Encounter: Payer: Self-pay | Admitting: *Deleted

## 2020-10-06 IMAGING — US TRANSVAGINAL ULTRASOUND OF PELVIS
1 series · 14 of 25 positions shown · non-contrast
Comparison: Pelvic ultrasound 02/22/2016

CLINICAL DATA: Abnormal bleeding.

EXAM:
ULTRASOUND PELVIS TRANSVAGINAL
TECHNIQUE: Transvaginal ultrasound examination of the pelvis was performed
including evaluation of the uterus, ovaries, adnexal regions, and
pelvic cul-de-sac.

[Series 1: transvaginal ultrasound of pelvis · 0.09mm/px · 14 of 72 slices shown]
[im 1/72]
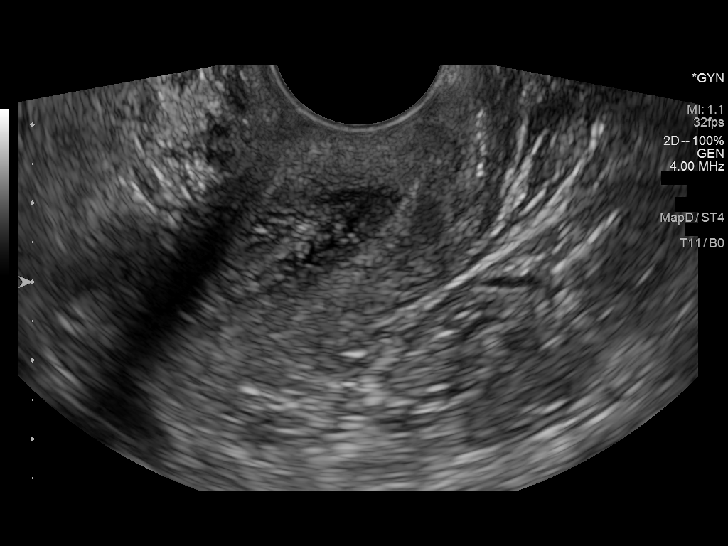
[im 6/72]
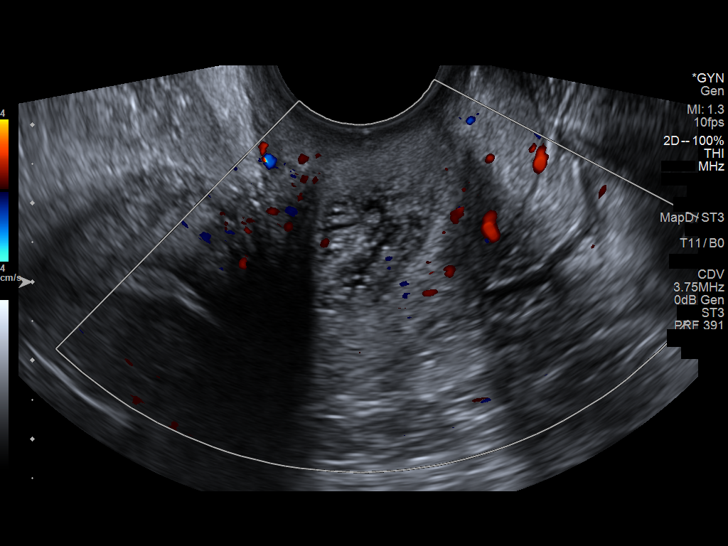
[im 12/72]
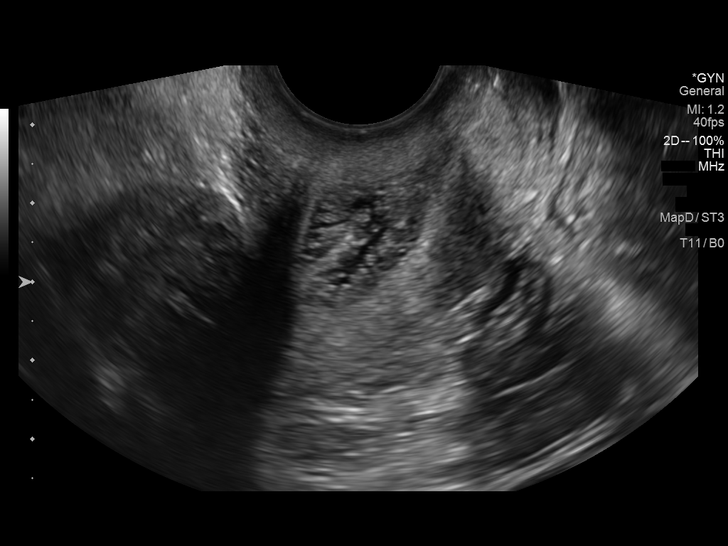
[im 18/72]
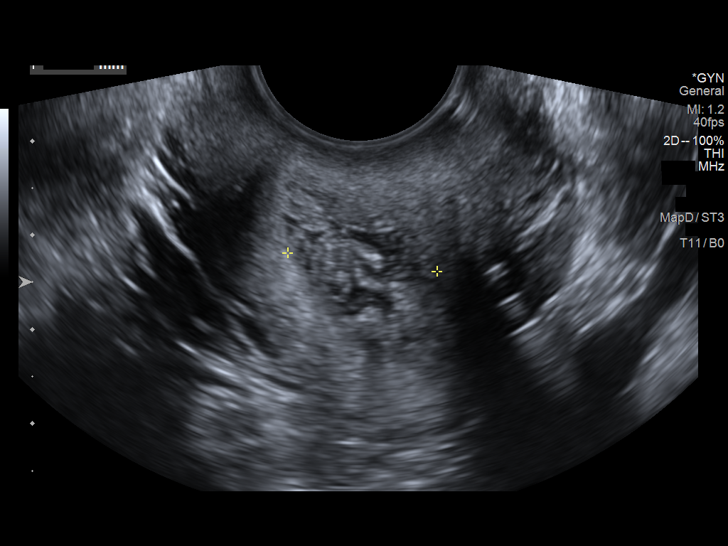
[im 24/72]
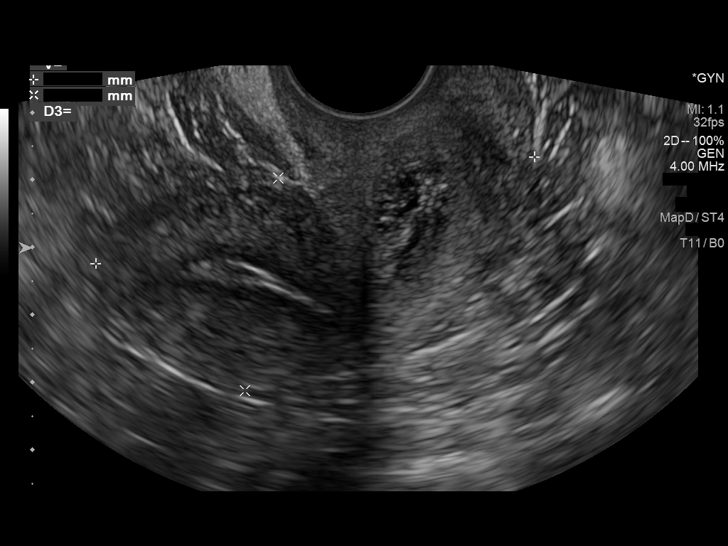
[im 27/72]
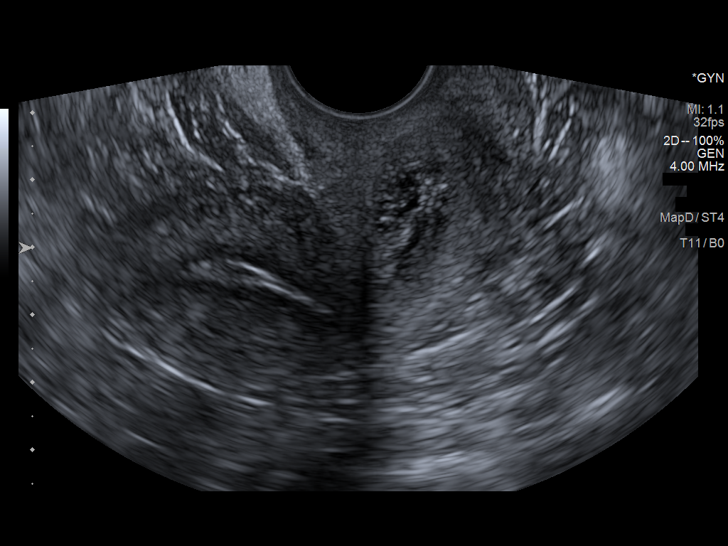
[im 33/72]
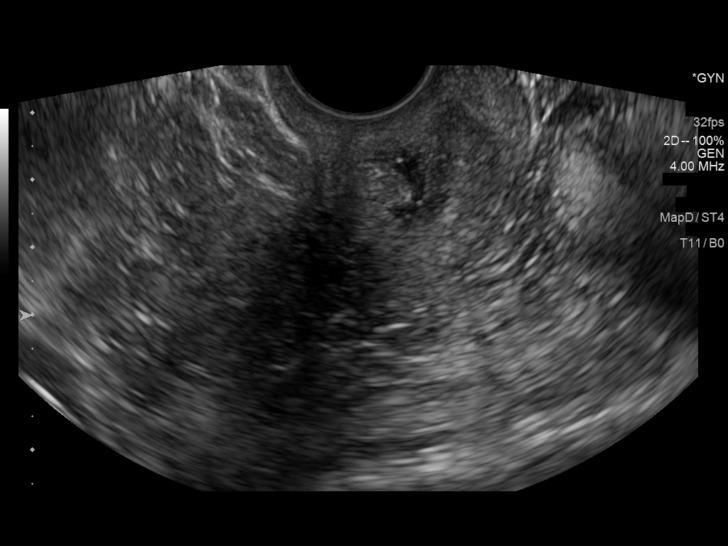
[im 39/72]
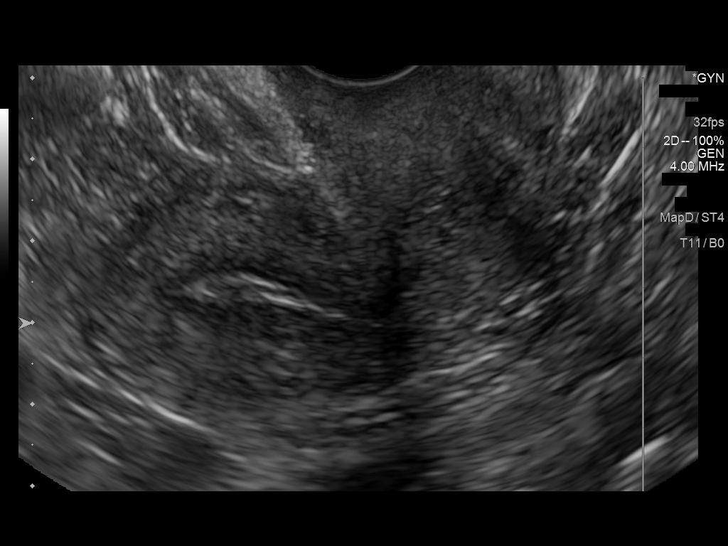
[im 45/72]
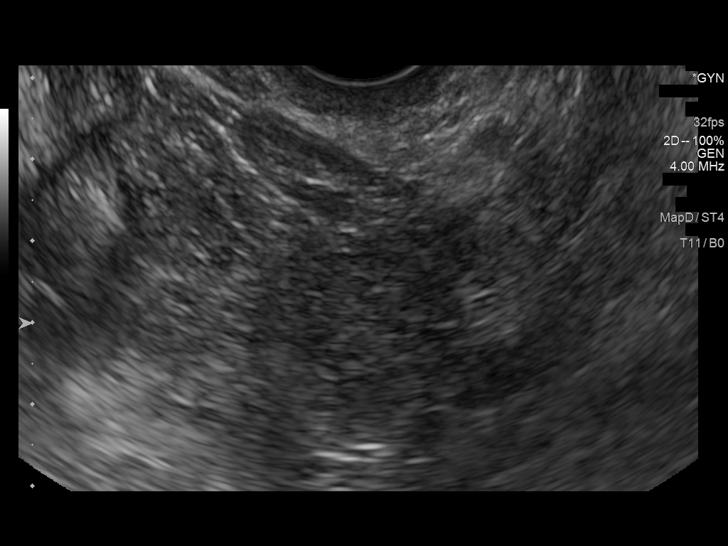
[im 48/72]
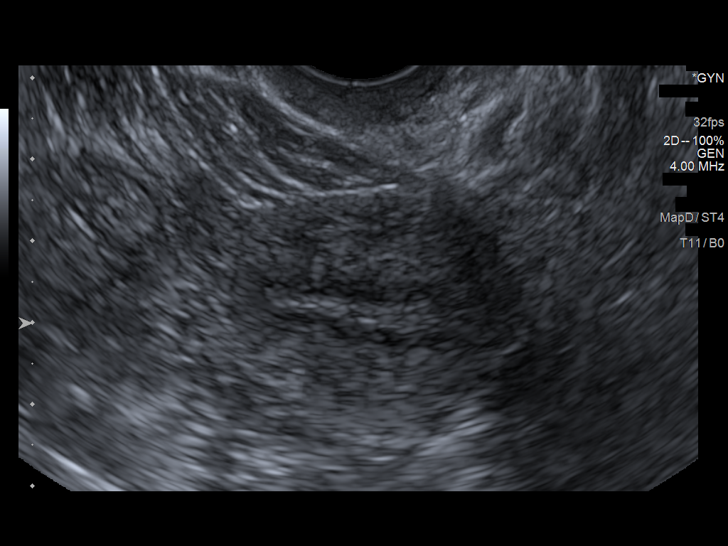
[im 54/72]
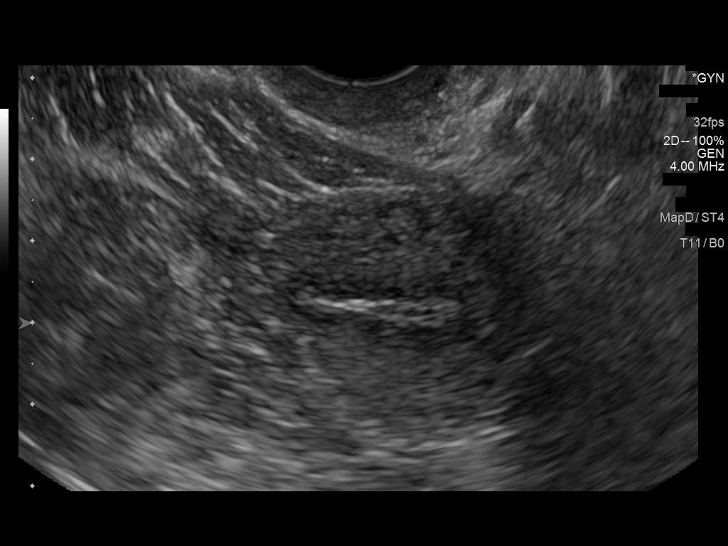
[im 60/72]
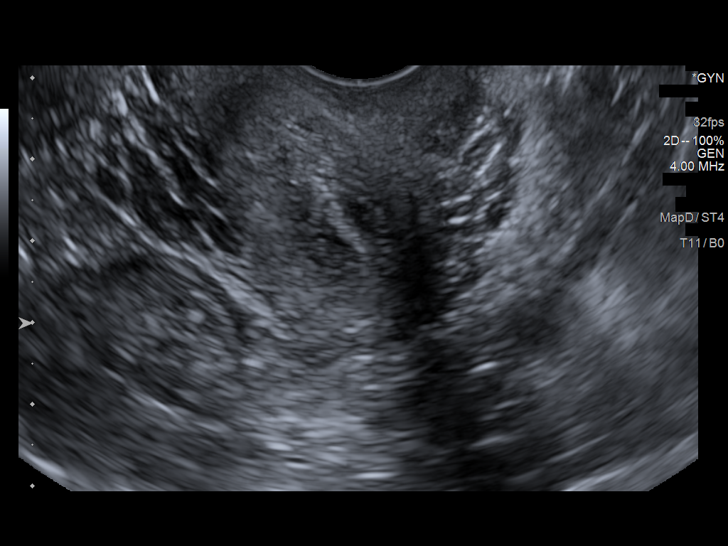
[im 66/72]
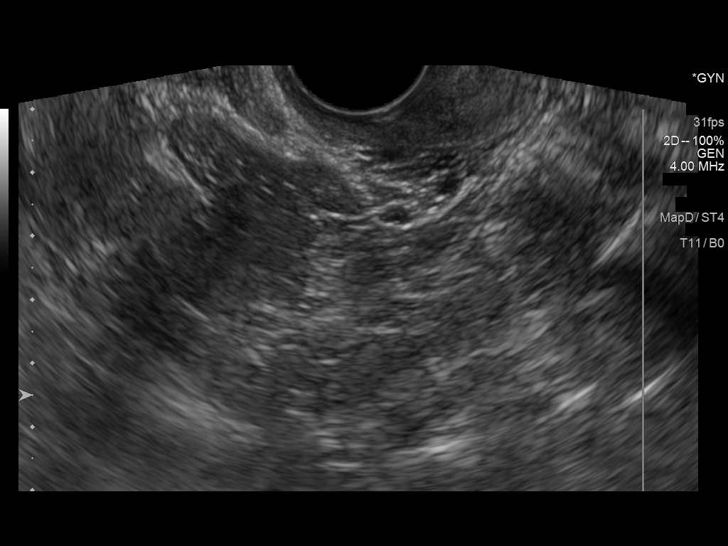
[im 72/72]
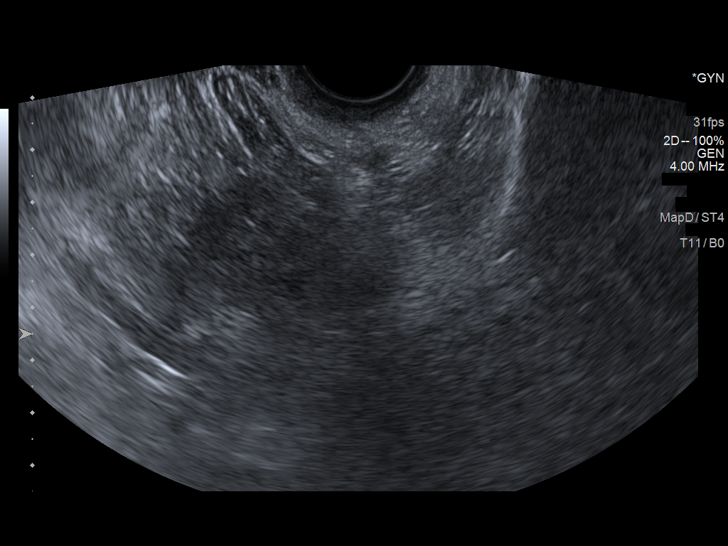

[14 of 25 positions shown; findings below may reference images not displayed]

FINDINGS: Uterus

Measurements: 6.7 x 3.2 x 3.7 cm = volume: 41 mL. No fibroids or
other mass visualized.There is apparent thickening and heterogeneity
of the endocervical canal. The abnormal region measures 2.7 x 1.3 x
1.6 cm.

Endometrium

Thickness: 3 mm.  No focal abnormality visualized.

Right ovary

Not visualized

Left ovary

Not visualized

Other findings:  No abnormal free fluid
IMPRESSION: There is nonspecific thickening and heterogeneity of the soft
tissues within the endometrial canal. Consider correlation with
direct visualization.

Endometrium measures 3 mm. If bleeding remains unresponsive to
hormonal or medical therapy, sonohysterogram should be considered
for focal lesion work-up. (Ref: Radiological Reasoning: Algorithmic
Workup of Abnormal Vaginal Bleeding with Endovaginal Sonography and
Sonohysterography. [id number]; [id number])
